# Patient Record
Sex: Male | Born: 1995 | Race: Black or African American | Hispanic: No | Marital: Single | Smoking: Never smoker
Health system: Southern US, Community
[De-identification: ages and names within clinical notes are randomized; demographics above are authoritative.]

## PROBLEM LIST (undated history)

## (undated) DIAGNOSIS — Z8719 Personal history of other diseases of the digestive system: Secondary | ICD-10-CM

## (undated) DIAGNOSIS — Z8711 Personal history of peptic ulcer disease: Secondary | ICD-10-CM

## (undated) DIAGNOSIS — I10 Essential (primary) hypertension: Secondary | ICD-10-CM

---
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
## 2014-04-23 ENCOUNTER — Encounter (HOSPITAL_COMMUNITY): Payer: Self-pay | Admitting: Emergency Medicine

## 2014-04-23 ENCOUNTER — Emergency Department (HOSPITAL_COMMUNITY)
Admission: EM | Admit: 2014-04-23 | Discharge: 2014-04-23 | Disposition: A | Discharge status: Home / Self Care | Payer: Medicaid Other | Attending: Emergency Medicine | Admitting: Emergency Medicine

## 2014-04-23 ENCOUNTER — Emergency Department (HOSPITAL_COMMUNITY): Payer: Medicaid Other

## 2014-04-23 DIAGNOSIS — S8990XA Unspecified injury of unspecified lower leg, initial encounter: Secondary | ICD-10-CM | POA: Insufficient documentation

## 2014-04-23 DIAGNOSIS — S0003XA Contusion of scalp, initial encounter: Secondary | ICD-10-CM | POA: Diagnosis not present

## 2014-04-23 DIAGNOSIS — S1093XA Contusion of unspecified part of neck, initial encounter: Principal | ICD-10-CM

## 2014-04-23 DIAGNOSIS — S99919A Unspecified injury of unspecified ankle, initial encounter: Secondary | ICD-10-CM

## 2014-04-23 DIAGNOSIS — S0993XA Unspecified injury of face, initial encounter: Secondary | ICD-10-CM | POA: Insufficient documentation

## 2014-04-23 DIAGNOSIS — S199XXA Unspecified injury of neck, initial encounter: Secondary | ICD-10-CM | POA: Diagnosis present

## 2014-04-23 DIAGNOSIS — S0083XA Contusion of other part of head, initial encounter: Secondary | ICD-10-CM | POA: Diagnosis not present

## 2014-04-23 DIAGNOSIS — M25571 Pain in right ankle and joints of right foot: Secondary | ICD-10-CM

## 2014-04-23 DIAGNOSIS — S99929A Unspecified injury of unspecified foot, initial encounter: Secondary | ICD-10-CM

## 2014-04-23 MED ORDER — HYDROCODONE-ACETAMINOPHEN 5-325 MG PO TABS
2.0000 | ORAL_TABLET | Freq: Once | ORAL | Status: AC
  Administered 2014-04-23: 2 via ORAL
  Filled 2014-04-23: qty 2
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
## 2014-04-23 NOTE — Progress Notes (Signed)
Orthopedic Tech Progress Note Patient Details:  Jeffrey Arias [birth year] [id number]  Ortho Devices Type of Ortho Device: Ankle Air splint Ortho Device/Splint Interventions: Application   Shawnie Pons 04/23/2014, 12:24 PM
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
## 2014-04-23 NOTE — ED Notes (Signed)
Pt and family members walked out without discharge instructions or discharge paperwork; No VS obtained before leaving

## 2014-04-24 ENCOUNTER — Encounter (HOSPITAL_COMMUNITY): Payer: Self-pay | Admitting: Emergency Medicine

## 2014-04-24 DIAGNOSIS — S5010XA Contusion of unspecified forearm, initial encounter: Secondary | ICD-10-CM | POA: Diagnosis not present

## 2014-04-24 DIAGNOSIS — S0003XA Contusion of scalp, initial encounter: Secondary | ICD-10-CM | POA: Insufficient documentation

## 2014-04-24 DIAGNOSIS — S0990XA Unspecified injury of head, initial encounter: Secondary | ICD-10-CM | POA: Diagnosis present

## 2014-04-24 DIAGNOSIS — S1093XA Contusion of unspecified part of neck, initial encounter: Principal | ICD-10-CM

## 2014-04-24 DIAGNOSIS — S0083XA Contusion of other part of head, initial encounter: Principal | ICD-10-CM | POA: Insufficient documentation
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
## 2014-04-25 ENCOUNTER — Emergency Department (HOSPITAL_COMMUNITY): Payer: Medicaid Other | Attending: Emergency Medicine

## 2014-04-25 ENCOUNTER — Emergency Department (HOSPITAL_COMMUNITY)
Admission: EM | Admit: 2014-04-25 | Discharge: 2014-04-25 | Disposition: A | Discharge status: Home / Self Care | Payer: Medicaid Other | Attending: Emergency Medicine | Admitting: Emergency Medicine

## 2014-04-25 DIAGNOSIS — R51 Headache: Secondary | ICD-10-CM

## 2014-04-25 DIAGNOSIS — R519 Headache, unspecified: Secondary | ICD-10-CM

## 2014-04-25 DIAGNOSIS — S0003XA Contusion of scalp, initial encounter: Secondary | ICD-10-CM

## 2014-04-25 DIAGNOSIS — S5011XA Contusion of right forearm, initial encounter: Secondary | ICD-10-CM

## 2014-04-25 MED ORDER — OXYCODONE-ACETAMINOPHEN 5-325 MG PO TABS: 1.0000 | ORAL_TABLET | Freq: Four times a day (QID) | ORAL | Status: DC | PRN

## 2014-04-25 MED ORDER — OXYCODONE-ACETAMINOPHEN 5-325 MG PO TABS
2.0000 | ORAL_TABLET | Freq: Once | ORAL | Status: AC
  Administered 2014-04-25: 2 via ORAL
  Filled 2014-04-25: qty 2

## 2014-04-25 NOTE — ED Notes (Signed)
Pt A&OX4, ambulatory at d/c with steady gait, NAD 

## 2014-04-25 NOTE — Discharge Instructions (Signed)
Recommend that you ice areas of injury. Take Percocet as needed for severe pain. Take naproxen for mild to moderate pain. Recommend he followup with your primary care provider. Return to the emergency department if you develop loss of consciousness, memory loss, numbness/weakness on one side of your body, or vision changes.  Contusion A contusion is a deep bruise. Contusions are the result of an injury that caused bleeding under the skin. The contusion may turn blue, purple, or yellow. Minor injuries will give you a painless contusion, but more severe contusions may stay painful and swollen for a few weeks.  CAUSES  A contusion is usually caused by a blow, trauma, or direct force to an area of the body. SYMPTOMS   Swelling and redness of the injured area.  Bruising of the injured area.  Tenderness and soreness of the injured area.  Pain. DIAGNOSIS  The diagnosis can be made by taking a history and physical exam. An X-ray, CT scan, or MRI may be needed to determine if there were any associated injuries, such as fractures. TREATMENT  Specific treatment will depend on what area of the body was injured. In general, the best treatment for a contusion is resting, icing, elevating, and applying cold compresses to the injured area. Over-the-counter medicines may also be recommended for pain control. Ask your caregiver what the best treatment is for your contusion. HOME CARE INSTRUCTIONS   Put ice on the injured area.  Put ice in a plastic bag.  Place a towel between your skin and the bag.  Leave the ice on for 15-20 minutes, 3-4 times a day, or as directed by your health care provider.  Only take over-the-counter or prescription medicines for pain, discomfort, or fever as directed by your caregiver. Your caregiver may recommend avoiding anti-inflammatory medicines (aspirin, ibuprofen, and naproxen) for 48 hours because these medicines may increase bruising.  Rest the injured area.  If  possible, elevate the injured area to reduce swelling. SEEK IMMEDIATE MEDICAL CARE IF:   You have increased bruising or swelling.  You have pain that is getting worse.  Your swelling or pain is not relieved with medicines. MAKE SURE YOU:   Understand these instructions.  Will watch your condition.  Will get help right away if you are not doing well or get worse. Document Released: 05/28/2005 Document Revised: 08/23/2013 Document Reviewed: 06/23/2011 [facility] Patient Information 2015 [place], [place]. This information is not intended to replace advice given to you by your health care provider. Make sure you discuss any questions you have with your health care provider.
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
## 2014-04-29 NOTE — ED Provider Notes (Signed)
Medical screening examination/treatment/procedure(s) were performed by non-physician practitioner and as supervising physician I was immediately available for consultation/collaboration.   EKG Interpretation None        Tomasita Crumble, MD 04/29/14 1714

## 2014-07-10 ENCOUNTER — Emergency Department (HOSPITAL_COMMUNITY)
Admission: EM | Admit: 2014-07-10 | Discharge: 2014-07-10 | Disposition: A | Discharge status: Home / Self Care | Payer: Medicaid Other | Attending: Emergency Medicine | Admitting: Emergency Medicine

## 2014-07-10 ENCOUNTER — Encounter (HOSPITAL_COMMUNITY): Payer: Self-pay | Admitting: Emergency Medicine

## 2014-07-10 DIAGNOSIS — M545 Low back pain, unspecified: Secondary | ICD-10-CM

## 2014-07-10 DIAGNOSIS — S3992XA Unspecified injury of lower back, initial encounter: Secondary | ICD-10-CM | POA: Diagnosis not present

## 2014-07-10 DIAGNOSIS — Y990 Civilian activity done for income or pay: Secondary | ICD-10-CM | POA: Insufficient documentation

## 2014-07-10 DIAGNOSIS — Y92410 Unspecified street and highway as the place of occurrence of the external cause: Secondary | ICD-10-CM | POA: Insufficient documentation

## 2014-07-10 DIAGNOSIS — Y9389 Activity, other specified: Secondary | ICD-10-CM | POA: Diagnosis not present

## 2014-07-10 MED ORDER — IBUPROFEN 800 MG PO TABS
800.0000 mg | ORAL_TABLET | Freq: Once | ORAL | Status: AC
  Administered 2014-07-10: 800 mg via ORAL
  Filled 2014-07-10: qty 1

## 2014-07-10 MED ORDER — CYCLOBENZAPRINE HCL 10 MG PO TABS
10.0000 mg | ORAL_TABLET | Freq: Once | ORAL | Status: AC
  Administered 2014-07-10: 10 mg via ORAL
  Filled 2014-07-10: qty 1

## 2014-07-10 MED ORDER — IBUPROFEN 800 MG PO TABS: 800.0000 mg | ORAL_TABLET | Freq: Three times a day (TID) | ORAL | Status: DC

## 2014-07-10 MED ORDER — CYCLOBENZAPRINE HCL 10 MG PO TABS: 10.0000 mg | ORAL_TABLET | Freq: Two times a day (BID) | ORAL | Status: DC | PRN
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
## 2014-07-10 NOTE — Discharge Instructions (Signed)
Muscle Strain °A muscle strain is an injury that occurs when a muscle is stretched beyond its normal length. Usually a small number of muscle fibers are torn when this happens. Muscle strain is rated in degrees. First-degree strains have the least amount of muscle fiber tearing and pain. Second-degree and third-degree strains have increasingly more tearing and pain.  °Usually, recovery from muscle strain takes 1-2 weeks. Complete healing takes 5-6 weeks.  °CAUSES  °Muscle strain happens when a sudden, violent force placed on a muscle stretches it too far. This may occur with lifting, sports, or a fall.  °RISK FACTORS °Muscle strain is especially common in athletes.  °SIGNS AND SYMPTOMS °At the site of the muscle strain, there may be: °· Pain. °· Bruising. °· Swelling. °· Difficulty using the muscle due to pain or lack of normal function. °DIAGNOSIS  °Your health care provider will perform a physical exam and ask about your medical history. °TREATMENT  °Often, the best treatment for a muscle strain is resting, icing, and applying cold compresses to the injured area.   °HOME CARE INSTRUCTIONS  °· Use the PRICE method of treatment to promote muscle healing during the first 2-3 days after your injury. The PRICE method involves: °· Protecting the muscle from being injured again. °· Restricting your activity and resting the injured body part. °· Icing your injury. To do this, put ice in a plastic bag. Place a towel between your skin and the bag. Then, apply the ice and leave it on from 15-20 minutes each hour. After the third day, switch to moist heat packs. °· Apply compression to the injured area with a splint or elastic bandage. Be careful not to wrap it too tightly. This may interfere with blood circulation or increase swelling. °· Elevate the injured body part above the level of your heart as often as you can. °· Only take over-the-counter or prescription medicines for pain, discomfort, or fever as directed by your  health care provider. °· Warming up prior to exercise helps to prevent future muscle strains. °SEEK MEDICAL CARE IF:  °· You have increasing pain or swelling in the injured area. °· You have numbness, tingling, or a significant loss of strength in the injured area. °MAKE SURE YOU:  °· Understand these instructions. °· Will watch your condition. °· Will get help right away if you are not doing well or get worse. °Document Released: 08/18/2005 Document Revised: 06/08/2013 Document Reviewed: 03/17/2013 °[employer]® Patient Information ©2015 [employer]. This information is not intended to replace advice given to you by your health care provider. Make sure you discuss any questions you have with your health care provider. ° °Motor Vehicle Collision °It is common to have multiple bruises and sore muscles after a motor vehicle collision (MVC). These tend to feel worse for the first 24 hours. You may have the most stiffness and soreness over the first several hours. You may also feel worse when you wake up the first morning after your collision. After this point, you will usually begin to improve with each day. The speed of improvement often depends on the severity of the collision, the number of injuries, and the location and nature of these injuries. °HOME CARE INSTRUCTIONS °· Put ice on the injured area. °¨ Put ice in a plastic bag. °¨ Place a towel between your skin and the bag. °¨ Leave the ice on for 15-20 minutes, 3-4 times a day, or as directed by your health care provider. °· Drink enough fluids to   keep your urine clear or pale yellow. Do not drink alcohol. °· Take a warm shower or bath once or twice a day. This will increase blood flow to sore muscles. °· You may return to activities as directed by your caregiver. Be careful when lifting, as this may aggravate neck or back pain. °· Only take over-the-counter or prescription medicines for pain, discomfort, or fever as directed by your caregiver. Do not use  aspirin. This may increase bruising and bleeding. °SEEK IMMEDIATE MEDICAL CARE IF: °· You have numbness, tingling, or weakness in the arms or legs. °· You develop severe headaches not relieved with medicine. °· You have severe neck pain, especially tenderness in the middle of the back of your neck. °· You have changes in bowel or bladder control. °· There is increasing pain in any area of the body. °· You have shortness of breath, light-headedness, dizziness, or fainting. °· You have chest pain. °· You feel sick to your stomach (nauseous), throw up (vomit), or sweat. °· You have increasing abdominal discomfort. °· There is blood in your urine, stool, or vomit. °· You have pain in your shoulder (shoulder strap areas). °· You feel your symptoms are getting worse. °MAKE SURE YOU: °· Understand these instructions. °· Will watch your condition. °· Will get help right away if you are not doing well or get worse. °Document Released: 08/18/2005 Document Revised: 01/02/2014 Document Reviewed: 01/15/2011 °[employer]® Patient Information ©2015 [employer]. This information is not intended to replace advice given to you by your health care provider. Make sure you discuss any questions you have with your health care provider. ° °
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
## 2014-07-31 ENCOUNTER — Emergency Department (HOSPITAL_COMMUNITY)
Admission: EM | Admit: 2014-07-31 | Discharge: 2014-07-31 | Disposition: A | Discharge status: Home / Self Care | Payer: Medicaid Other | Attending: Emergency Medicine | Admitting: Emergency Medicine

## 2014-07-31 ENCOUNTER — Encounter (HOSPITAL_COMMUNITY): Payer: Self-pay | Admitting: Emergency Medicine

## 2014-07-31 DIAGNOSIS — L089 Local infection of the skin and subcutaneous tissue, unspecified: Secondary | ICD-10-CM

## 2014-07-31 MED ORDER — HYDROCORTISONE 1 % EX CREA: TOPICAL_CREAM | CUTANEOUS | Status: AC

## 2014-07-31 MED ORDER — CEPHALEXIN 500 MG PO CAPS: 500.0000 mg | ORAL_CAPSULE | Freq: Four times a day (QID) | ORAL | Status: AC

## 2014-07-31 MED ORDER — LIDOCAINE HCL 1 % IJ SOLN
5.0000 mL | Freq: Once | INTRAMUSCULAR | Status: DC
  Filled 2014-07-31: qty 20

## 2014-07-31 NOTE — ED Provider Notes (Signed)
CSN: [id number]     Arrival date & time 07/31/14  1035 History   First MD Initiated Contact with Patient 07/31/14 1045     Chief Complaint  Patient presents with  . Recurrent Skin Infections     (Consider location/radiation/quality/duration/timing/severity/associated sxs/prior Treatment) HPI   Patient to the ED due to pustule to his left abdominal wall that started two days ago. He says it is painful to touch and red. He has not had fevers, nausea, vomiting, diarrhea. He has not had abdominal pain, weakness, dysuria, weakness. The patient appears well. Denies getting bit by an insect.  History reviewed. No pertinent past medical history. History reviewed. No pertinent past surgical history. No family history on file. History  Substance Use Topics  . Smoking status: Never Smoker   . Smokeless tobacco: Not on file  . Alcohol Use: Yes     Comment: Occasional    Review of Systems  10 Systems reviewed and are negative for acute change except as noted in the HPI.     Allergies  Review of patient's allergies indicates no known allergies.  Home Medications   Prior to Admission medications   Medication Sig Start Date End Date Taking? Authorizing Provider  ibuprofen (ADVIL,MOTRIN) 200 MG tablet Take 400 mg by mouth every 6 (six) hours as needed for moderate pain.   Yes Historical Provider, MD  cephALEXin (KEFLEX) 500 MG capsule Take 1 capsule (500 mg total) by mouth 4 (four) times daily. 07/31/14   Chonte Ricke Irine SealG Marlissa Emerick, PA-C  cyclobenzaprine (FLEXERIL) 10 MG tablet Take 1 tablet (10 mg total) by mouth 2 (two) times daily as needed for muscle spasms. Patient not taking: Reported on 07/31/2014 07/10/14   Melvenia BeamShari A Upstill, PA-C  hydrocortisone cream 1 % Apply to affected area 2 times daily for pain and inflammation 07/31/14   Dorthula Matasiffany G Daire Okimoto, PA-C  ibuprofen (ADVIL,MOTRIN) 800 MG tablet Take 1 tablet (800 mg total) by mouth 3 (three) times daily. Patient not taking: Reported on 07/31/2014  07/10/14   Melvenia BeamShari A Upstill, PA-C  oxyCODONE-acetaminophen (PERCOCET/ROXICET) 5-325 MG per tablet Take 1-2 tablets by mouth every 6 (six) hours as needed for moderate pain or severe pain. Patient not taking: Reported on 07/31/2014 04/25/14   Antony MaduraKelly Humes, PA-C   BP 140/78 mmHg  Pulse 72  Temp(Src) 97 F (36.1 C)  Resp 14  SpO2 100% Physical Exam  Constitutional: He appears well-developed and well-nourished. No distress.  HENT:  Head: Normocephalic and atraumatic.  Eyes: Pupils are equal, round, and reactive to light.  Neck: Normal range of motion. Neck supple.  Cardiovascular: Normal rate and regular rhythm.   Pulmonary/Chest: Effort normal.  Abdominal: Soft. Bowel sounds are normal. He exhibits no distension. There is no tenderness. There is no rigidity, no rebound, no guarding and no CVA tenderness. No hernia.    Abdomen is soft.  Neurological: He is alert.  Skin: Skin is warm and dry.  Nursing note and vitals reviewed.   ED Course  Procedures (including critical care time) Labs Review Labs Reviewed - No data to display  Imaging Review No results found.   EKG Interpretation None      MDM   Final diagnoses:  Pustule   Warm compresses, hydrocortisone cream and Keflex. The patient has no systemic symptoms of deeper infection it appears very superficial. Surrounding cellulitis most likely from irritation. He has been given  Strict return to ED precautions.  18 y.o.Jeffrey Arias's evaluation in the Emergency Department is complete. It has  been determined that no acute conditions requiring further emergency intervention are present at this time. The patient/guardian have been advised of the diagnosis and plan. We have discussed signs and symptoms that warrant return to the ED, such as changes or worsening in symptoms.  Vital signs are stable at discharge. Filed Vitals:   07/31/14 1046  BP: 140/78  Pulse: 72  Temp: 97 F (36.1 C)  Resp: 14    Patient/guardian has  voiced understanding and agreed to follow-up with the PCP or specialist.     Dorthula Matasiffany G Dex Blakely, PA-C 07/31/14 1922  Enid SkeensJoshua M Zavitz, MD 08/02/14 [phone]

## 2014-07-31 NOTE — Discharge Instructions (Signed)

## 2014-07-31 NOTE — ED Notes (Signed)
Pt reports abdominal furuncle that started two days ago, painful to touch, red contour. Denies fever.

## 2014-09-23 ENCOUNTER — Emergency Department (HOSPITAL_COMMUNITY)
Admission: EM | Admit: 2014-09-23 | Discharge: 2014-09-24 | Disposition: A | Discharge status: Home / Self Care | Payer: Medicaid Other | Attending: Emergency Medicine | Admitting: Emergency Medicine

## 2014-09-23 ENCOUNTER — Encounter (HOSPITAL_COMMUNITY): Payer: Self-pay | Admitting: Emergency Medicine

## 2014-09-23 DIAGNOSIS — Y998 Other external cause status: Secondary | ICD-10-CM | POA: Insufficient documentation

## 2014-09-23 DIAGNOSIS — F1210 Cannabis abuse, uncomplicated: Secondary | ICD-10-CM | POA: Diagnosis not present

## 2014-09-23 DIAGNOSIS — X58XXXA Exposure to other specified factors, initial encounter: Secondary | ICD-10-CM | POA: Insufficient documentation

## 2014-09-23 DIAGNOSIS — Z791 Long term (current) use of non-steroidal anti-inflammatories (NSAID): Secondary | ICD-10-CM | POA: Insufficient documentation

## 2014-09-23 DIAGNOSIS — Y9389 Activity, other specified: Secondary | ICD-10-CM | POA: Insufficient documentation

## 2014-09-23 DIAGNOSIS — Y9289 Other specified places as the place of occurrence of the external cause: Secondary | ICD-10-CM | POA: Insufficient documentation

## 2014-09-23 DIAGNOSIS — F329 Major depressive disorder, single episode, unspecified: Secondary | ICD-10-CM | POA: Diagnosis not present

## 2014-09-23 DIAGNOSIS — T65892A Toxic effect of other specified substances, intentional self-harm, initial encounter: Secondary | ICD-10-CM | POA: Diagnosis not present

## 2014-09-23 DIAGNOSIS — Z7952 Long term (current) use of systemic steroids: Secondary | ICD-10-CM | POA: Diagnosis not present

## 2014-09-23 DIAGNOSIS — Z008 Encounter for other general examination: Secondary | ICD-10-CM | POA: Diagnosis present

## 2014-09-23 DIAGNOSIS — F322 Major depressive disorder, single episode, severe without psychotic features: Secondary | ICD-10-CM | POA: Diagnosis present

## 2014-09-23 DIAGNOSIS — Z792 Long term (current) use of antibiotics: Secondary | ICD-10-CM | POA: Diagnosis not present

## 2014-09-23 DIAGNOSIS — X838XXA Intentional self-harm by other specified means, initial encounter: Secondary | ICD-10-CM

## 2014-09-23 LAB — COMPREHENSIVE METABOLIC PANEL
ALT: 28 U/L (ref 0–53)
ANION GAP: 7 (ref 5–15)
AST: 29 U/L (ref 0–37)
Albumin: 4.7 g/dL (ref 3.5–5.2)
Alkaline Phosphatase: 45 U/L (ref 39–117)
BILIRUBIN TOTAL: 0.7 mg/dL (ref 0.3–1.2)
BUN: 12 mg/dL (ref 6–23)
CHLORIDE: 104 mmol/L (ref 96–112)
CO2: 29 mmol/L (ref 19–32)
Calcium: 9.3 mg/dL (ref 8.4–10.5)
Creatinine, Ser: 0.81 mg/dL (ref 0.50–1.35)
GFR calc Af Amer: >90 mL/min (ref >90)
GFR calc non Af Amer: >90 mL/min (ref >90)
Glucose, Bld: 99 mg/dL (ref 70–99)
Potassium: 3.5 mmol/L (ref 3.5–5.1)
Sodium: 140 mmol/L (ref 135–145)
TOTAL PROTEIN: 7.5 g/dL (ref 6.0–8.3)

## 2014-09-23 LAB — SALICYLATE LEVEL: Salicylate Lvl: <4 mg/dL (ref 2.8–20.0)

## 2014-09-23 LAB — CBC
HCT: 44.1 % (ref 39.0–52.0)
HEMOGLOBIN: 14.9 g/dL (ref 13.0–17.0)
MCH: 31.7 pg (ref 26.0–34.0)
MCHC: 33.8 g/dL (ref 30.0–36.0)
MCV: 93.8 fL (ref 78.0–100.0)
Platelets: 205 10*3/uL (ref 150–400)
RBC: 4.7 MIL/uL (ref 4.22–5.81)
RDW: 12.4 % (ref 11.5–15.5)
WBC: 6.3 10*3/uL (ref 4.0–10.5)

## 2014-09-23 LAB — RAPID URINE DRUG SCREEN, HOSP PERFORMED
AMPHETAMINES: NOT DETECTED
BARBITURATES: NOT DETECTED
BENZODIAZEPINES: NOT DETECTED
COCAINE: NOT DETECTED
Opiates: NOT DETECTED
TETRAHYDROCANNABINOL: POSITIVE — AB

## 2014-09-23 LAB — ETHANOL: Alcohol, Ethyl (B): <5 mg/dL (ref 0–9)

## 2014-09-23 LAB — ACETAMINOPHEN LEVEL: Acetaminophen (Tylenol), Serum: <10 ug/mL — ABNORMAL LOW (ref 10–30)

## 2014-09-23 MED ORDER — NICOTINE 21 MG/24HR TD PT24
21.0000 mg | MEDICATED_PATCH | Freq: Every day | TRANSDERMAL | Status: DC
  Filled 2014-09-23: qty 1

## 2014-09-23 MED ORDER — ONDANSETRON HCL 4 MG PO TABS: 4.0000 mg | ORAL_TABLET | Freq: Three times a day (TID) | ORAL | Status: DC | PRN

## 2014-09-23 MED ORDER — ZOLPIDEM TARTRATE 5 MG PO TABS: 5.0000 mg | ORAL_TABLET | Freq: Every evening | ORAL | Status: DC | PRN

## 2014-09-23 MED ORDER — ALUM & MAG HYDROXIDE-SIMETH 200-200-20 MG/5ML PO SUSP: 30.0000 mL | ORAL | Status: DC | PRN

## 2014-09-23 MED ORDER — IBUPROFEN 200 MG PO TABS: 600.0000 mg | ORAL_TABLET | Freq: Three times a day (TID) | ORAL | Status: DC | PRN

## 2014-09-23 NOTE — ED Notes (Signed)
[facility] Expected date:  Expected time:  Means of arrival:  Comments: EMS 3M drank spic and span
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
## 2014-09-23 NOTE — ED Notes (Signed)
Poison Control called , case has already reported by EMS to Ms. Cheryl . Ms. Cheryl recommended to keep monitoring pt. For abdominal pain ,N/V, changes of V/S for the next 2 hours , none noted as of this time. Will continue to monitor pt.

## 2014-09-23 NOTE — ED Provider Notes (Signed)
CSN: [id number]     Arrival date & time 09/23/14  2001 History   First MD Initiated Contact with Patient 09/23/14 2001     Chief Complaint  Patient presents with  . Medical Clearance      HPI Patient reports he ingested a partially 7 ounces of a multipurpose cleaner called "spic and span" in attempt to harm himself.  He reports she's been depressed.  His been depressed for some time.  He has never spoken with a physician about this.  No prior hospitalizations a behavior health.  No prior suicide attempts.  Denies hallucinations.  Reports occasional alcohol use.  Denies tobacco abuse.  No other complaints except for mild nausea at this time.  No vomiting.   No past medical history on file. No past surgical history on file. No family history on file. History  Substance Use Topics  . Smoking status: Never Smoker   . Smokeless tobacco: Not on file  . Alcohol Use: Yes     Comment: Occasional    Review of Systems  All other systems reviewed and are negative.     Allergies  Review of patient's allergies indicates no known allergies.  Home Medications   Prior to Admission medications   Medication Sig Start Date End Date Taking? Authorizing Provider  ibuprofen (ADVIL,MOTRIN) 200 MG tablet Take 400 mg by mouth every 6 (six) hours as needed for moderate pain.   Yes Historical Provider, MD  cephALEXin (KEFLEX) 500 MG capsule Take 1 capsule (500 mg total) by mouth 4 (four) times daily. 07/31/14   Tiffany Irine Seal, PA-C  cyclobenzaprine (FLEXERIL) 10 MG tablet Take 1 tablet (10 mg total) by mouth 2 (two) times daily as needed for muscle spasms. Patient not taking: Reported on 07/31/2014 07/10/14   Melvenia Beam A Upstill, PA-C  hydrocortisone cream 1 % Apply to affected area 2 times daily for pain and inflammation 07/31/14   Dorthula Matas, PA-C  ibuprofen (ADVIL,MOTRIN) 800 MG tablet Take 1 tablet (800 mg total) by mouth 3 (three) times daily. Patient not taking: Reported on 07/31/2014 07/10/14    Melvenia Beam A Upstill, PA-C  oxyCODONE-acetaminophen (PERCOCET/ROXICET) 5-325 MG per tablet Take 1-2 tablets by mouth every 6 (six) hours as needed for moderate pain or severe pain. Patient not taking: Reported on 07/31/2014 04/25/14   Antony Madura, PA-C   There were no vitals taken for this visit. Physical Exam  Constitutional: He is oriented to person, place, and time. He appears well-developed and well-nourished.  HENT:  Head: Normocephalic and atraumatic.  Eyes: EOM are normal.  Neck: Normal range of motion.  Cardiovascular: Normal rate, regular rhythm, normal heart sounds and intact distal pulses.   Pulmonary/Chest: Effort normal and breath sounds normal. No respiratory distress.  Abdominal: Soft. He exhibits no distension. There is no tenderness.  Musculoskeletal: Normal range of motion.  Neurological: He is alert and oriented to person, place, and time.  Skin: Skin is warm and dry.  Psychiatric:  Depressed.  Flat affect.  Suicidal thoughts  Nursing note and vitals reviewed.   ED Course  Procedures (including critical care time) Labs Review Labs Reviewed  ACETAMINOPHEN LEVEL - Abnormal; Notable for the following:    Acetaminophen (Tylenol), Serum <10.0 (*)    All other components within normal limits  URINE RAPID DRUG SCREEN (HOSP PERFORMED) - Abnormal; Notable for the following:    Tetrahydrocannabinol POSITIVE (*)    All other components within normal limits  CBC  COMPREHENSIVE METABOLIC PANEL  ETHANOL  SALICYLATE LEVEL    Imaging Review No results found.   EKG Interpretation None      MDM   Final diagnoses:  None    Patient is medically clear at this time.  Patient will be evaluated by TTS.   Lyanne CoKevin M Shanasia Ibrahim, MD 09/23/14 2122

## 2014-09-23 NOTE — ED Notes (Signed)
Pt. Stated " i did not intend to kill myself by drinking that solution, i just have so much going on lately, i wanted to see my daughter.'

## 2014-09-23 NOTE — ED Notes (Signed)
Pt arrived via EMS, pt  Ingested unk. amount of Spic and Span multi purpose cleaner. Per pt was,this was a new bottle when he started. Estimated amount 7 oz. Per EMS. He states he just has a lot going on. He just needs to rest. C/o centralized chest pain after ingestion. 12 lead done and he was give zofran 4mg  IV in route.

## 2014-09-23 NOTE — ED Notes (Signed)
Pt. To [facility] from ED ambulatory without difficulty, to room  . Report from Lillie-Bell RN. Pt. Is alert and oriented, warm and dry in no distress. Pt. Denies SI, HI, and AVH. Pt. Calm and cooperative. Pt. Made aware of security cameras and Q15 minute rounds. Pt. Encouraged to let Nursing staff know of any concerns or needs.

## 2014-09-23 NOTE — BH Assessment (Signed)
Assessment completed. Consulted Alberteen SamFran Hobson, NP who recommended that pt be observed overnight and evaluated by psychiatry in the am. Dr. Patria Maneampos has been informed of the recommendation.

## 2014-09-23 NOTE — ED Notes (Signed)
TTS on going at consultation room.

## 2014-09-23 NOTE — ED Notes (Addendum)
Dr. Patria Maneampos at bedside and gave OK  For pt.'s family to come and see pt. At bedside before moving to Psych ED. Security aware, wanded pt., personal belongs and family . Pt. Is cooperative and calm. Pt./ on paper scrubs.

## 2014-09-24 DIAGNOSIS — F322 Major depressive disorder, single episode, severe without psychotic features: Secondary | ICD-10-CM

## 2014-09-24 NOTE — Consult Note (Signed)
[facility] Face-to-Face Psychiatry Consult   Reason for Consult:  Suicide attempt Referring Physician:  EDP Patient Identification: Jeffrey Arias MRN:  [id number] Principal Diagnosis: Severe major depression, single episode, without psychotic features Diagnosis:   Patient Active Problem List   Diagnosis Date Noted  . Severe major depression, single episode, without psychotic features [F32.2] 09/24/2014    Total Time spent with patient: 45 minutes  Subjective:   Jeffrey Arias is a 19 y.o. male patient admitted with Major depression, suicide attempt.  HPI:  AA male, 19 years old was evaluated for depression after he drank some cleaning liquid"Spic &Span"  Patient is remorseful today and stated that he will not repeat an attempt to kill himself.  Patient reported his stressors included not being able to see his 8 months daughter.  Patient reported that his baby's mother and her family have prevented him from seeing his daughter.  Patient reported that he has been feeling very depressed for not being able to see his daughter.  This has  made his depression Worse resulting in him feeling hopeless and helpless.  Patient denied previous SA.  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  He also planned to see a Psychiatrist at [place] this coming Monday to start treatment for depression.  Patient denies SI/HI/AVH today.  He will be discharged home and will go to [facility] first thing tomorrow morning.  Patient will be discharged home now.  HPI Elements:   Location:  Major depression, single episode, Suicide attempt.. Quality:  insomnia, angry feelings, poor energy, feelings of helplessness. Severity:  severe, drank cleaning Liquid. Timing:  Acute. Duration:  Few months back. Context:  Brought in by Ambulance after drinking cleaning Liquid.  Past Medical History: History reviewed. No pertinent past medical history. History reviewed. No pertinent past surgical history. Family  History: History reviewed. No pertinent family history. Social History:  History  Alcohol Use  . Yes    Comment: Occasional     History  Drug Use No    History   Social History  . Marital Status: Single    Spouse Name: N/A    Number of Children: N/A  . Years of Education: N/A   Social History Main Topics  . Smoking status: Never Smoker   . Smokeless tobacco: None  . Alcohol Use: Yes     Comment: Occasional  . Drug Use: No  . Sexual Activity: None   Other Topics Concern  . None   Social History Narrative   Additional Social History:    Pain Medications: pt denies abuse  Prescriptions: pt denies abuse  Over the Counter: pt denies abuse  History of alcohol / drug use?: Yes Longest period of sobriety (when/how long): 2 months  Name of Substance 1: THC  1 - Age of First Use: 15 1 - Amount (size/oz): "3 1/2 grams"  1 - Frequency: daily  1 - Duration: ongoing  1 - Last Use / Amount: "beginning of year"   Allergies:  No Known Allergies  Vitals: Blood pressure 103/43, pulse 72, temperature 97.5 F (36.4 C), temperature source Oral, resp. rate 18, SpO2 100 %.  Risk to Self: Suicidal Ideation: No-Not Currently/Within Last 6 Months (Pt denies SI but ingested household cleaner today. ) Suicidal Intent: No-Not Currently/Within Last 6 Months (Pt ingested spic and span today.) Is patient at risk for suicide?: Yes (PT ingested spic and span. ) Suicidal Plan?: No (PT denies having a plan but  ingested spic and span earlier ) Access to Means:  (Pt ingested spic and span. ) What has been your use of drugs/alcohol within the last 12 months?: Pt reported that he smokes THC.  How many times?: 0 Other Self Harm Risks: No other self harm risk identified at this time.  Triggers for Past Attempts: None known Intentional Self Injurious Behavior: None Risk to Others: Homicidal Ideation: No Thoughts of Harm to Others: No Current Homicidal Intent: No Current Homicidal Plan: No Access  to Homicidal Means: No Identified Victim: NA History of harm to others?: Yes (Injury to real property charge. ) Assessment of Violence: On admission Violent Behavior Description: No violent behaviors observed at this time. Pt is calm and cooperative.  Does patient have access to weapons?: No Criminal Charges Pending?: Yes Describe Pending Criminal Charges: Injury to real property  Does patient have a court date: Yes Court Date: 10/06/14 Prior Inpatient Therapy: Prior Inpatient Therapy: No Prior Outpatient Therapy: Prior Outpatient Therapy: Yes Prior Therapy Dates: 2006 [facility]  Reason for Treatment: Anger  Current Facility-Administered Medications  Medication Dose Route Frequency Provider Last Rate Last Dose  . alum & mag hydroxide-simeth (MAALOX/MYLANTA) 200-200-20 MG/5ML suspension 30 mL  30 mL Oral PRN Lyanne CoKevin M Campos, MD      . ibuprofen (ADVIL,MOTRIN) tablet 600 mg  600 mg Oral Q8H PRN Lyanne CoKevin M Campos, MD      . nicotine (NICODERM CQ - dosed in mg/24 hours) patch 21 mg  21 mg Transdermal Daily Lyanne CoKevin M Campos, MD   Stopped at 09/24/14 [phone]  . ondansetron (ZOFRAN) tablet 4 mg  4 mg Oral Q8H PRN Lyanne CoKevin M Campos, MD      . zolpidem [facility](AMBIEN) tablet 5 mg  5 mg Oral QHS PRN Lyanne CoKevin M Campos, MD       Current Outpatient Prescriptions  Medication Sig Dispense Refill  . ibuprofen (ADVIL,MOTRIN) 200 MG tablet Take 400 mg by mouth every 6 (six) hours as needed for moderate pain.    . cephALEXin (KEFLEX) 500 MG capsule Take 1 capsule (500 mg total) by mouth 4 (four) times daily. 40 capsule 0  . cyclobenzaprine (FLEXERIL) 10 MG tablet Take 1 tablet (10 mg total) by mouth 2 (two) times daily as needed for muscle spasms. (Patient not taking: Reported on 07/31/2014) 20 tablet 0  . hydrocortisone cream 1 % Apply to affected area 2 times daily for pain and inflammation 15 g 0  . ibuprofen (ADVIL,MOTRIN) 800 MG tablet Take 1 tablet (800 mg total) by mouth 3 (three)  times daily. (Patient not taking: Reported on 07/31/2014) 21 tablet 0  . oxyCODONE-acetaminophen (PERCOCET/ROXICET) 5-325 MG per tablet Take 1-2 tablets by mouth every 6 (six) hours as needed for moderate pain or severe pain. (Patient not taking: Reported on 07/31/2014) 15 tablet 0    Musculoskeletal: Strength & Muscle Tone: within normal limits Gait & Station: normal Patient leans: N/A  Psychiatric Specialty Exam:     Blood pressure 103/43, pulse 72, temperature 97.5 F (36.4 C), temperature source Oral, resp. rate 18, SpO2 100 %.There is no weight on file to calculate BMI.  General Appearance: Casual  Eye Contact::  Good  Speech:  Clear and Coherent and Normal Rate  Volume:  Normal  Mood:  Depressed, Hopeless and helpless  Affect:  Congruent, Depressed and Flat  Thought Process:  Coherent, Goal Directed and Intact  Orientation:  Full (Time, Place, and Person)  Thought Content:  WDL  Suicidal Thoughts:  No  Homicidal Thoughts:  No  Memory:  Immediate;   Good Recent;   Good Remote;   Good  Judgement:  Good  Insight:  Good  Psychomotor Activity:  Normal  Concentration:  Good  Recall:  NA  Fund of Knowledge:Good  Language: Good  Akathisia:  NA  Handed:  Right  AIMS (if indicated):     Assets:  Desire for Improvement  ADL's:  Intact  Cognition: WNL  Sleep:      Medical Decision Making: Established Problem, Stable/Improving (1)  Problem Points: Review of psycho-social stressors (1)  Data Points: Review or order clinical lab tests (1)  Treatment Plan Summary: Discharge home to follow up with [facility] outpatient favcility.  Plan:  No evidence of imminent risk to self or others at present.   Supportive therapy provided about ongoing stressors. Discussed crisis plan, support from social network, calling 911, coming to the Emergency Department, and calling Suicide Hotline. Discharge home Disposition: Discharge home  Earney Navy   PMHNP-BC 09/24/2014 10:46  AM  Patient seen, evaluated and I agree with notes by Nurse Practitioner. Thedore Mins, MD

## 2014-09-24 NOTE — [facility] Suicide Risk Assessment (Cosign Needed)
Suicide Risk Assessment  Discharge Assessment   [facility]   Demographic Factors:  Male, Low socioeconomic status and Unemployed  Total Time spent with patient: 30 minutes  Musculoskeletal: Strength & Muscle Tone: within normal limits Gait & Station: normal Patient leans: N/A  Psychiatric Specialty Exam:     Blood pressure 103/43, pulse 72, temperature 97.5 F (36.4 C), temperature source Oral, resp. rate 18, SpO2 100 %.There is no weight on file to calculate BMI.  General Appearance: Casual  Eye Contact::  Good  Speech:  Clear and Coherent and Normal Rate409  Volume:  Normal  Mood:  Angry, Depressed, Hopeless and helpless  Affect:  Congruent and Depressed  Thought Process:  Coherent, Goal Directed and Intact  Orientation:  Full (Time, Place, and Person)  Thought Content:  WDL  Suicidal Thoughts:  No  Homicidal Thoughts:  No  Memory:  Immediate;   Good Recent;   Good Remote;   Good  Judgement:  Good  Insight:  Good  Psychomotor Activity:  Normal  Concentration:  Good  Recall:  NA  Fund of Knowledge:Good  Language: Good  Akathisia:  NA  Handed:  Right  AIMS (if indicated):     Assets:  Desire for Improvement  Sleep:     Cognition: WNL  ADL's:  Intact      Has this patient used any form of tobacco in the last 30 days? (Cigarettes, Smokeless Tobacco, Cigars, and/or Pipes) N/A  Mental Status Per Nursing Assessment::   On Admission:     Current Mental Status by Physician: NA  Loss Factors: NA  Historical Factors: NA  Risk Reduction Factors:   Responsible for children under 20 years of age, Sense of responsibility to family, Living with another person, especially a relative and Want to live and see "my daughter grow up and thrive"  Continued Clinical Symptoms:  Depression:   Hopelessness Insomnia  Cognitive Features That Contribute To Risk:  Polarized thinking    Suicide Risk:  Minimal: No identifiable suicidal ideation.   Patients presenting with no risk factors but with morbid ruminations; may be classified as minimal risk based on the severity of the depressive symptoms  Principal Problem: Severe major depression, single episode, without psychotic features Discharge Diagnoses:  Patient Active Problem List   Diagnosis Date Noted  . Severe major depression, single episode, without psychotic features [F32.2] 09/24/2014      Plan Of Care/Follow-up recommendations:  Activity:  As tolerated Diet:  regular  Is patient on multiple antipsychotic therapies at discharge:  No   Has Patient had three or more failed trials of antipsychotic monotherapy by history:  No  Recommended Plan for Multiple Antipsychotic Therapies: NA    Gavriela Cashin, C   PMHNP-BC 09/24/2014, 11:16 AM

## 2015-02-10 ENCOUNTER — Encounter (HOSPITAL_COMMUNITY): Payer: Self-pay

## 2015-02-10 ENCOUNTER — Emergency Department (HOSPITAL_COMMUNITY): Payer: Medicaid Other

## 2015-02-10 ENCOUNTER — Emergency Department (HOSPITAL_COMMUNITY)
Admission: EM | Admit: 2015-02-10 | Discharge: 2015-02-10 | Disposition: A | Discharge status: Home / Self Care | Payer: Medicaid Other | Attending: Emergency Medicine | Admitting: Emergency Medicine

## 2015-02-10 DIAGNOSIS — Z23 Encounter for immunization: Secondary | ICD-10-CM | POA: Insufficient documentation

## 2015-02-10 DIAGNOSIS — S50312A Abrasion of left elbow, initial encounter: Secondary | ICD-10-CM | POA: Insufficient documentation

## 2015-02-10 DIAGNOSIS — S80211A Abrasion, right knee, initial encounter: Secondary | ICD-10-CM | POA: Diagnosis not present

## 2015-02-10 DIAGNOSIS — S90811A Abrasion, right foot, initial encounter: Secondary | ICD-10-CM | POA: Insufficient documentation

## 2015-02-10 DIAGNOSIS — S0990XA Unspecified injury of head, initial encounter: Secondary | ICD-10-CM | POA: Diagnosis present

## 2015-02-10 DIAGNOSIS — S40212A Abrasion of left shoulder, initial encounter: Secondary | ICD-10-CM | POA: Diagnosis not present

## 2015-02-10 DIAGNOSIS — S8002XA Contusion of left knee, initial encounter: Secondary | ICD-10-CM | POA: Diagnosis not present

## 2015-02-10 DIAGNOSIS — Y9389 Activity, other specified: Secondary | ICD-10-CM | POA: Insufficient documentation

## 2015-02-10 DIAGNOSIS — T07XXXA Unspecified multiple injuries, initial encounter: Secondary | ICD-10-CM

## 2015-02-10 DIAGNOSIS — Y92410 Unspecified street and highway as the place of occurrence of the external cause: Secondary | ICD-10-CM | POA: Diagnosis not present

## 2015-02-10 DIAGNOSIS — Z79899 Other long term (current) drug therapy: Secondary | ICD-10-CM | POA: Insufficient documentation

## 2015-02-10 DIAGNOSIS — S9031XA Contusion of right foot, initial encounter: Secondary | ICD-10-CM

## 2015-02-10 DIAGNOSIS — W19XXXA Unspecified fall, initial encounter: Secondary | ICD-10-CM

## 2015-02-10 DIAGNOSIS — Y998 Other external cause status: Secondary | ICD-10-CM | POA: Diagnosis not present

## 2015-02-10 DIAGNOSIS — S80212A Abrasion, left knee, initial encounter: Secondary | ICD-10-CM | POA: Diagnosis not present

## 2015-02-10 DIAGNOSIS — R52 Pain, unspecified: Secondary | ICD-10-CM

## 2015-02-10 MED ORDER — ONDANSETRON 4 MG PO TBDP
8.0000 mg | ORAL_TABLET | Freq: Once | ORAL | Status: AC
  Administered 2015-02-10: 8 mg via ORAL
  Filled 2015-02-10: qty 2

## 2015-02-10 MED ORDER — TETANUS-DIPHTH-ACELL PERTUSSIS 5-2.5-18.5 LF-MCG/0.5 IM SUSP
0.5000 mL | Freq: Once | INTRAMUSCULAR | Status: AC
  Administered 2015-02-10: 0.5 mL via INTRAMUSCULAR
  Filled 2015-02-10: qty 0.5

## 2015-02-10 MED ORDER — BACITRACIN ZINC 500 UNIT/GM EX OINT
TOPICAL_OINTMENT | Freq: Two times a day (BID) | CUTANEOUS | Status: DC
  Administered 2015-02-10: 1 via TOPICAL
  Filled 2015-02-10: qty 0.9

## 2015-02-10 MED ORDER — OXYCODONE-ACETAMINOPHEN 5-325 MG PO TABS
2.0000 | ORAL_TABLET | Freq: Once | ORAL | Status: AC
  Administered 2015-02-10: 2 via ORAL
  Filled 2015-02-10: qty 2

## 2015-02-10 MED ORDER — HYDROCODONE-ACETAMINOPHEN 5-325 MG PO TABS: 1.0000 | ORAL_TABLET | Freq: Four times a day (QID) | ORAL | Status: AC | PRN
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
## 2015-02-10 NOTE — Discharge Instructions (Signed)
It was our pleasure to provide your ER care today - we hope that you feel better.  Keep abrasions very clean - wash with warm water and soap 2x/day. You may apply a thin coat of bactracin to areas for the next few days.  Take motrin or aleve as need for pain. You may also take hydrocodone as need for pain. No driving for the next 6 hours or when taking hydrocodone. Also, do not take tylenol or acetaminophen containing medication when taking hydrocodone.  Follow up with primary care doctor in 1 week.  Return to ER if worse, new symptoms, severe headache, persistent vomiting, new or severe pain, infection of wounds, other concern.  You were given pain medication in the ER - no driving for the next 4 hours.     Abrasion An abrasion is a cut or scrape of the skin. Abrasions do not extend through all layers of the skin and most heal within 10 days. It is important to care for your abrasion properly to prevent infection. CAUSES  Most abrasions are caused by falling on, or gliding across, the ground or other surface. When your skin rubs on something, the outer and inner layer of skin rubs off, causing an abrasion. DIAGNOSIS  Your caregiver will be able to diagnose an abrasion during a physical exam.  TREATMENT  Your treatment depends on how large and deep the abrasion is. Generally, your abrasion will be cleaned with water and a mild soap to remove any dirt or debris. An antibiotic ointment may be put over the abrasion to prevent an infection. A bandage (dressing) may be wrapped around the abrasion to keep it from getting dirty.  You may need a tetanus shot if:  You cannot remember when you had your last tetanus shot.  You have never had a tetanus shot.  The injury broke your skin. If you get a tetanus shot, your arm may swell, get red, and feel warm to the touch. This is common and not a problem. If you need a tetanus shot and you choose not to have one, there is a rare chance of getting  tetanus. Sickness from tetanus can be serious.  HOME CARE INSTRUCTIONS   If a dressing was applied, change it at least once a day or as directed by your caregiver. If the bandage sticks, soak it off with warm water.   Wash the area with water and a mild soap to remove all the ointment 2 times a day. Rinse off the soap and pat the area dry with a clean towel.   Reapply any ointment as directed by your caregiver. This will help prevent infection and keep the bandage from sticking. Use gauze over the wound and under the dressing to help keep the bandage from sticking.   Change your dressing right away if it becomes wet or dirty.   Only take over-the-counter or prescription medicines for pain, discomfort, or fever as directed by your caregiver.   Follow up with your caregiver within 24-48 hours for a wound check, or as directed. If you were not given a wound-check appointment, look closely at your abrasion for redness, swelling, or pus. These are signs of infection. SEEK IMMEDIATE MEDICAL CARE IF:   You have increasing pain in the wound.   You have redness, swelling, or tenderness around the wound.   You have pus coming from the wound.   You have a fever or persistent symptoms for more than 2-3 days.  You have a  fever and your symptoms suddenly get worse.  You have a bad smell coming from the wound or dressing.  MAKE SURE YOU:   Understand these instructions.  Will watch your condition.  Will get help right away if you are not doing well or get worse. Document Released: 05/28/2005 Document Revised: 08/04/2012 Document Reviewed: 07/22/2011 [facility] Patient Information 2015 [place], [place]. This information is not intended to replace advice given to you by your health care provider. Make sure you discuss any questions you have with your health care provider.    Contusion A contusion is a deep bruise. Contusions are the result of an injury that caused bleeding under the  skin. The contusion may turn blue, purple, or yellow. Minor injuries will give you a painless contusion, but more severe contusions may stay painful and swollen for a few weeks.  CAUSES  A contusion is usually caused by a blow, trauma, or direct force to an area of the body. SYMPTOMS   Swelling and redness of the injured area.  Bruising of the injured area.  Tenderness and soreness of the injured area.  Pain. DIAGNOSIS  The diagnosis can be made by taking a history and physical exam. An X-ray, CT scan, or MRI may be needed to determine if there were any associated injuries, such as fractures. TREATMENT  Specific treatment will depend on what area of the body was injured. In general, the best treatment for a contusion is resting, icing, elevating, and applying cold compresses to the injured area. Over-the-counter medicines may also be recommended for pain control. Ask your caregiver what the best treatment is for your contusion. HOME CARE INSTRUCTIONS   Put ice on the injured area.  Put ice in a plastic bag.  Place a towel between your skin and the bag.  Leave the ice on for 15-20 minutes, 3-4 times a day, or as directed by your health care provider.  Only take over-the-counter or prescription medicines for pain, discomfort, or fever as directed by your caregiver. Your caregiver may recommend avoiding anti-inflammatory medicines (aspirin, ibuprofen, and naproxen) for 48 hours because these medicines may increase bruising.  Rest the injured area.  If possible, elevate the injured area to reduce swelling. SEEK IMMEDIATE MEDICAL CARE IF:   You have increased bruising or swelling.  You have pain that is getting worse.  Your swelling or pain is not relieved with medicines. MAKE SURE YOU:   Understand these instructions.  Will watch your condition.  Will get help right away if you are not doing well or get worse. Document Released: 05/28/2005 Document Revised: 08/23/2013  Document Reviewed: 06/23/2011 [facility] Patient Information 2015 [place], [place]. This information is not intended to replace advice given to you by your health care provider. Make sure you discuss any questions you have with your health care provider.   Head Injury You have received a head injury. It does not appear serious at this time. Headaches and vomiting are common following head injury. It should be easy to awaken from sleeping. Sometimes it is necessary for you to stay in the emergency department for a while for observation. Sometimes admission to the hospital may be needed. After injuries such as yours, most problems occur within the first 24 hours, but side effects may occur up to 7-10 days after the injury. It is important for you to carefully monitor your condition and contact your health care provider or seek immediate medical care if there is a change in your condition. WHAT ARE THE  TYPES OF HEAD INJURIES? Head injuries can be as minor as a bump. Some head injuries can be more severe. More severe head injuries include:  A jarring injury to the brain (concussion).  A bruise of the brain (contusion). This mean there is bleeding in the brain that can cause swelling.  A cracked skull (skull fracture).  Bleeding in the brain that collects, clots, and forms a bump (hematoma). WHAT CAUSES A HEAD INJURY? A serious head injury is most likely to happen to someone who is in a car wreck and is not wearing a seat belt. Other causes of major head injuries include bicycle or motorcycle accidents, sports injuries, and falls. HOW ARE HEAD INJURIES DIAGNOSED? A complete history of the event leading to the injury and your current symptoms will be helpful in diagnosing head injuries. Many times, pictures of the brain, such as CT or MRI are needed to see the extent of the injury. Often, an overnight hospital stay is necessary for observation.  WHEN SHOULD I SEEK IMMEDIATE MEDICAL CARE?  You should get  help right away if:  You have confusion or drowsiness.  You feel sick to your stomach (nauseous) or have continued, forceful vomiting.  You have dizziness or unsteadiness that is getting worse.  You have severe, continued headaches not relieved by medicine. Only take over-the-counter or prescription medicines for pain, fever, or discomfort as directed by your health care provider.  You do not have normal function of the arms or legs or are unable to walk.  You notice changes in the black spots in the center of the colored part of your eye (pupil).  You have a clear or bloody fluid coming from your nose or ears.  You have a loss of vision. During the next 24 hours after the injury, you must stay with someone who can watch you for the warning signs. This person should contact local emergency services (911 in the [place] if you have seizures, you become unconscious, or you are unable to wake up. HOW CAN I PREVENT A HEAD INJURY IN THE FUTURE? The most important factor for preventing major head injuries is avoiding motor vehicle accidents. To minimize the potential for damage to your head, it is crucial to wear seat belts while riding in motor vehicles. Wearing helmets while bike riding and playing collision sports (like football) is also helpful. Also, avoiding dangerous activities around the house will further help reduce your risk of head injury.  WHEN CAN I RETURN TO NORMAL ACTIVITIES AND ATHLETICS? You should be reevaluated by your health care provider before returning to these activities. If you have any of the following symptoms, you should not return to activities or contact sports until 1 week after the symptoms have stopped:  Persistent headache.  Dizziness or vertigo.  Poor attention and concentration.  Confusion.  Memory problems.  Nausea or vomiting.  Fatigue or tire easily.  Irritability.  Intolerant of bright lights or loud noises.  Anxiety or  depression.  Disturbed sleep. MAKE SURE YOU:   Understand these instructions.  Will watch your condition.  Will get help right away if you are not doing well or get worse. Document Released: 08/18/2005 Document Revised: 08/23/2013 Document Reviewed: 04/25/2013 [facility] Patient Information 2015 [place], [place]. This information is not intended to replace advice given to you by your health care provider. Make sure you discuss any questions you have with your health care provider.
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
## 2015-02-22 ENCOUNTER — Emergency Department (HOSPITAL_COMMUNITY)
Admission: EM | Admit: 2015-02-22 | Discharge: 2015-02-22 | Disposition: A | Discharge status: Home / Self Care | Payer: Medicaid Other | Attending: Emergency Medicine | Admitting: Emergency Medicine

## 2015-02-22 ENCOUNTER — Encounter (HOSPITAL_COMMUNITY): Payer: Self-pay | Admitting: Emergency Medicine

## 2015-02-22 DIAGNOSIS — L02415 Cutaneous abscess of right lower limb: Secondary | ICD-10-CM | POA: Diagnosis present

## 2015-02-22 DIAGNOSIS — L02416 Cutaneous abscess of left lower limb: Secondary | ICD-10-CM | POA: Diagnosis not present

## 2015-02-22 DIAGNOSIS — I10 Essential (primary) hypertension: Secondary | ICD-10-CM | POA: Insufficient documentation

## 2015-02-22 DIAGNOSIS — L0291 Cutaneous abscess, unspecified: Secondary | ICD-10-CM

## 2015-02-22 DIAGNOSIS — Z8719 Personal history of other diseases of the digestive system: Secondary | ICD-10-CM | POA: Insufficient documentation

## 2015-02-22 DIAGNOSIS — Z792 Long term (current) use of antibiotics: Secondary | ICD-10-CM | POA: Diagnosis not present

## 2015-02-22 HISTORY — DX: Essential (primary) hypertension: I10

## 2015-02-22 HISTORY — DX: Personal history of other diseases of the digestive system: Z87.19

## 2015-02-22 HISTORY — DX: Personal history of peptic ulcer disease: Z87.11

## 2015-02-22 MED ORDER — LIDOCAINE-EPINEPHRINE 2 %-1:100000 IJ SOLN
INTRAMUSCULAR | Status: AC
  Administered 2015-02-22: 12:00:00
  Filled 2015-02-22: qty 1

## 2015-02-22 MED ORDER — LIDOCAINE-EPINEPHRINE (PF) 2 %-1:200000 IJ SOLN: 10.0000 mL | Freq: Once | INTRAMUSCULAR | Status: DC

## 2015-02-22 MED ORDER — NAPROXEN 500 MG PO TABS: 500.0000 mg | ORAL_TABLET | Freq: Two times a day (BID) | ORAL | Status: AC

## 2015-02-22 MED ORDER — SULFAMETHOXAZOLE-TRIMETHOPRIM 800-160 MG PO TABS: 2.0000 | ORAL_TABLET | Freq: Two times a day (BID) | ORAL | Status: AC

## 2015-02-22 NOTE — ED Provider Notes (Signed)
CSN: [id number]     Arrival date & time 02/22/15  1008 History   First MD Initiated Contact with Patient 02/22/15 1023     Chief Complaint  Patient presents with  . Abscess    2 red raised areas, one spot on the outer aspect of each thigh     (Consider location/radiation/quality/duration/timing/severity/associated sxs/prior Treatment) HPI Comments: Patient presents today with two raised erythematous areas on the upper lateral thigh of both thighs.  He states that both of the areas have been there for the past 4 days and are gradually worsening.  He states that he thinks that he was bitten by a spider, but did not see a spider or actually feel the bite.  He states that he squeezed the areas a couple of days ago and expressed some purulent fluid.  He denies fever, chills, nausea, or vomiting.  He reports a history of Abscesses.  No history of DM or HIV.    Patient is a 19 y.o. male presenting with abscess. The history is provided by the patient.  Abscess   Past Medical History  Diagnosis Date  . Hypertension   . History of stomach ulcers    History reviewed. No pertinent past surgical history. No family history on file. History  Substance Use Topics  . Smoking status: Never Smoker   . Smokeless tobacco: Not on file  . Alcohol Use: Yes     Comment: Occasional    Review of Systems  All other systems reviewed and are negative.     Allergies  Review of patient's allergies indicates no known allergies.  Home Medications   Prior to Admission medications   Medication Sig Start Date End Date Taking? Authorizing Provider  cephALEXin (KEFLEX) 500 MG capsule Take 1 capsule (500 mg total) by mouth 4 (four) times daily. 07/31/14   Marlon Pel, PA-C  HYDROcodone-acetaminophen (NORCO/VICODIN) 5-325 MG per tablet Take 1-2 tablets by mouth every 6 (six) hours as needed for moderate pain. 02/10/15   Cathren Laine, MD  hydrocortisone cream 1 % Apply to affected area 2 times daily for pain  and inflammation 07/31/14   Tiffany Greene, PA-C   BP 130/75 mmHg  Pulse 89  Temp(Src) 98.5 F (36.9 C) (Oral)  Resp 18  SpO2 97% Physical Exam  Constitutional: He appears well-developed and well-nourished.  HENT:  Head: Normocephalic and atraumatic.  Neck: Normal range of motion. Neck supple.  Cardiovascular: Normal rate, regular rhythm and normal heart sounds.   Pulmonary/Chest: Effort normal and breath sounds normal.  Musculoskeletal: Normal range of motion.  Neurological: He is alert.  Skin: Skin is warm and dry.     Psychiatric: He has a normal mood and affect.  Nursing note and vitals reviewed.   ED Course  Procedures (including critical care time) Labs Review Labs Reviewed - No data to display  Imaging Review No results found.   EKG Interpretation None     INCISION AND DRAINAGE Performed by: Santiago Glad Consent: Verbal consent obtained. Risks and benefits: risks, benefits and alternatives were discussed Type: abscess  Body area: right lateral upper thigh  Anesthesia: local infiltration  Incision was made with a scalpel.  Local anesthetic: lidocaine 2% with epinephrine  Anesthetic total: 3 ml  Complexity: complex Blunt dissection to break up loculations  Drainage: purulent  Drainage amount: scant  Patient tolerance: Patient tolerated the procedure well with no immediate complications.  INCISION AND DRAINAGE Performed by: Santiago Glad Consent: Verbal consent obtained. Risks and benefits: risks, benefits  and alternatives were discussed Type: abscess  Body area: left upper lateral thigh  Anesthesia: local infiltration  Incision was made with a scalpel.  Local anesthetic: lidocaine 2% with epinephrine  Anesthetic total: 3 ml  Complexity: complex Blunt dissection to break up loculations  Drainage: purulent  Drainage amount: small  Patient tolerance: Patient tolerated the procedure well with no immediate  complications.       MDM   Final diagnoses:  None   Patient presents today with a chief complaint of an abscess on both the left upper thigh and the right upper thigh.  Patient is afebrile.  No systemic signs of infection.  No erythematous streaking.  Abscesses incised and drained in the ED.  Patient stable for discharge.  Return precautions given.    Santiago Glad, PA-C 02/22/15 1201  Derwood Kaplan, MD 02/23/15 [phone]

## 2015-02-22 NOTE — ED Notes (Signed)
2 red rraised spots noted on outer aspect of thighs, one on each. Approx, 2cm red swollen area surrounds each abscess. Pt is concerned that he may have spider bites

## 2016-06-23 ENCOUNTER — Encounter (HOSPITAL_COMMUNITY): Payer: Self-pay | Admitting: *Deleted

## 2016-06-23 ENCOUNTER — Emergency Department (HOSPITAL_COMMUNITY)
Admission: EM | Admit: 2016-06-23 | Discharge: 2016-06-23 | Disposition: A | Discharge status: Home / Self Care | Payer: Self-pay | Attending: Emergency Medicine | Admitting: Emergency Medicine

## 2016-06-23 ENCOUNTER — Emergency Department (HOSPITAL_COMMUNITY): Payer: Self-pay

## 2016-06-23 DIAGNOSIS — Y939 Activity, unspecified: Secondary | ICD-10-CM | POA: Insufficient documentation

## 2016-06-23 DIAGNOSIS — S62353A Nondisplaced fracture of shaft of third metacarpal bone, left hand, initial encounter for closed fracture: Secondary | ICD-10-CM | POA: Insufficient documentation

## 2016-06-23 DIAGNOSIS — Y999 Unspecified external cause status: Secondary | ICD-10-CM | POA: Insufficient documentation

## 2016-06-23 DIAGNOSIS — Y929 Unspecified place or not applicable: Secondary | ICD-10-CM | POA: Insufficient documentation

## 2016-06-23 DIAGNOSIS — I10 Essential (primary) hypertension: Secondary | ICD-10-CM | POA: Insufficient documentation

## 2016-06-23 MED ORDER — HYDROCODONE-ACETAMINOPHEN 5-325 MG PO TABS
1.0000 | ORAL_TABLET | Freq: Once | ORAL | Status: AC
  Administered 2016-06-23: 1 via ORAL
  Filled 2016-06-23: qty 1

## 2016-06-23 MED ORDER — NAPROXEN 500 MG PO TABS: 500.0000 mg | ORAL_TABLET | Freq: Two times a day (BID) | ORAL | 0 refills | Status: AC | PRN

## 2016-06-23 MED ORDER — HYDROCODONE-ACETAMINOPHEN 5-325 MG PO TABS: 1.0000 | ORAL_TABLET | Freq: Four times a day (QID) | ORAL | 0 refills | Status: AC | PRN
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
## 2016-06-23 NOTE — ED Notes (Signed)
Ice pack applied to left hand.

## 2016-06-23 NOTE — ED Notes (Signed)
Declined W/C at D/C and was escorted to lobby by RN. 
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
## 2016-06-23 NOTE — Progress Notes (Signed)
Orthopedic Tech Progress Note Patient Details:  Jeffrey RamsayDavid Masley 11/26/1995 [id number]  Ortho Devices Type of Ortho Device: Ace wrap, Volar splint Ortho Device/Splint Location: lue Ortho Device/Splint Interventions: Application   [name] 06/23/2016, 11:11 AM

## 2016-06-23 NOTE — Discharge Instructions (Signed)
Wear wrist/hand splint until you see the hand specialist. Ice and elevate wrist throughout the day, using ice pack for no more than 20 minutes every hour.  Alternate between naprosyn and norco for pain relief. Do not drive or operate machinery with pain medication use. Call hand specialist follow up today or tomorrow to schedule followup appointment for recheck and ongoing management of your hand fracture in the next 5-7 days. Return to the ER for changes or worsening symptoms.

## 2016-10-27 IMAGING — DX DG KNEE COMPLETE 4+V*R*
4 series · 4 of 4 positions shown · non-contrast
Comparison: None.

CLINICAL DATA: Fall with knee pain, initial encounter

EXAM:
RIGHT KNEE - COMPLETE 4+ VIEW

[knee ap]
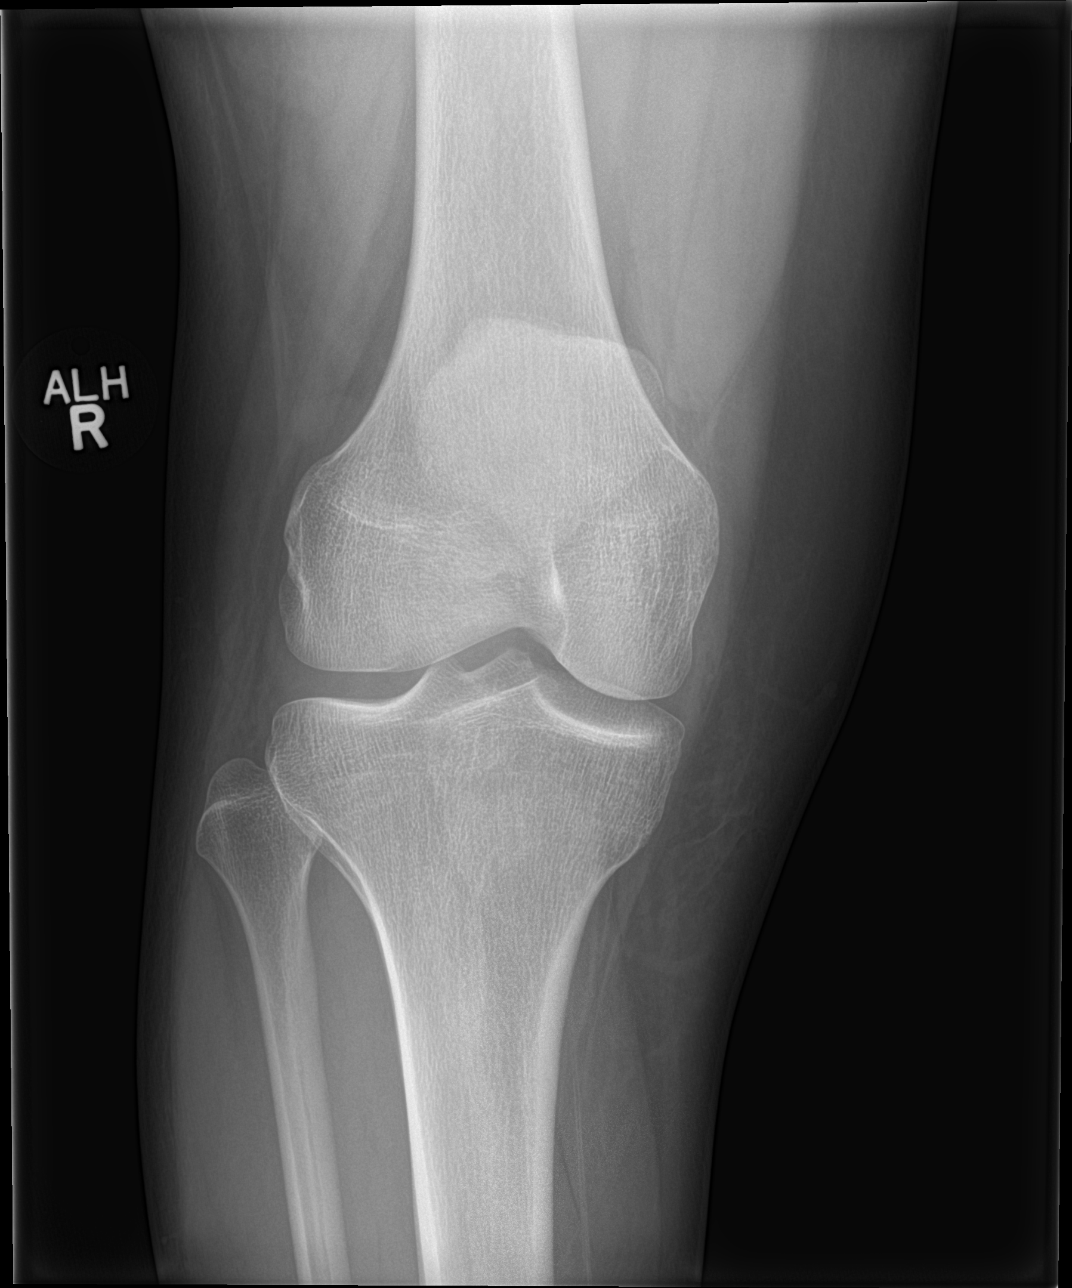

[knee lat]
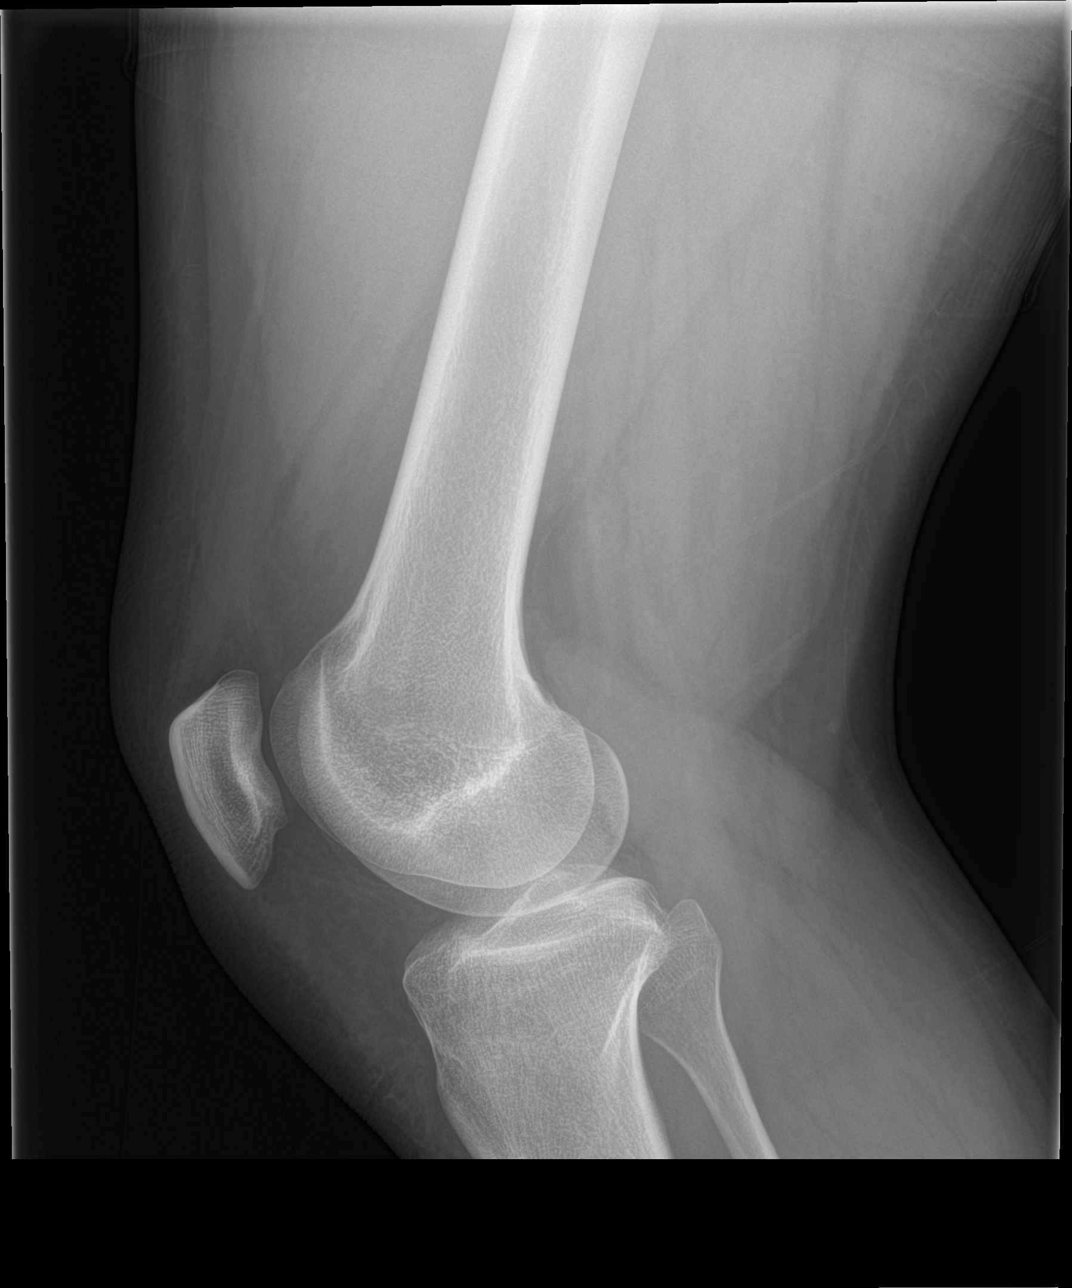

[knee obl (1 of 2)]
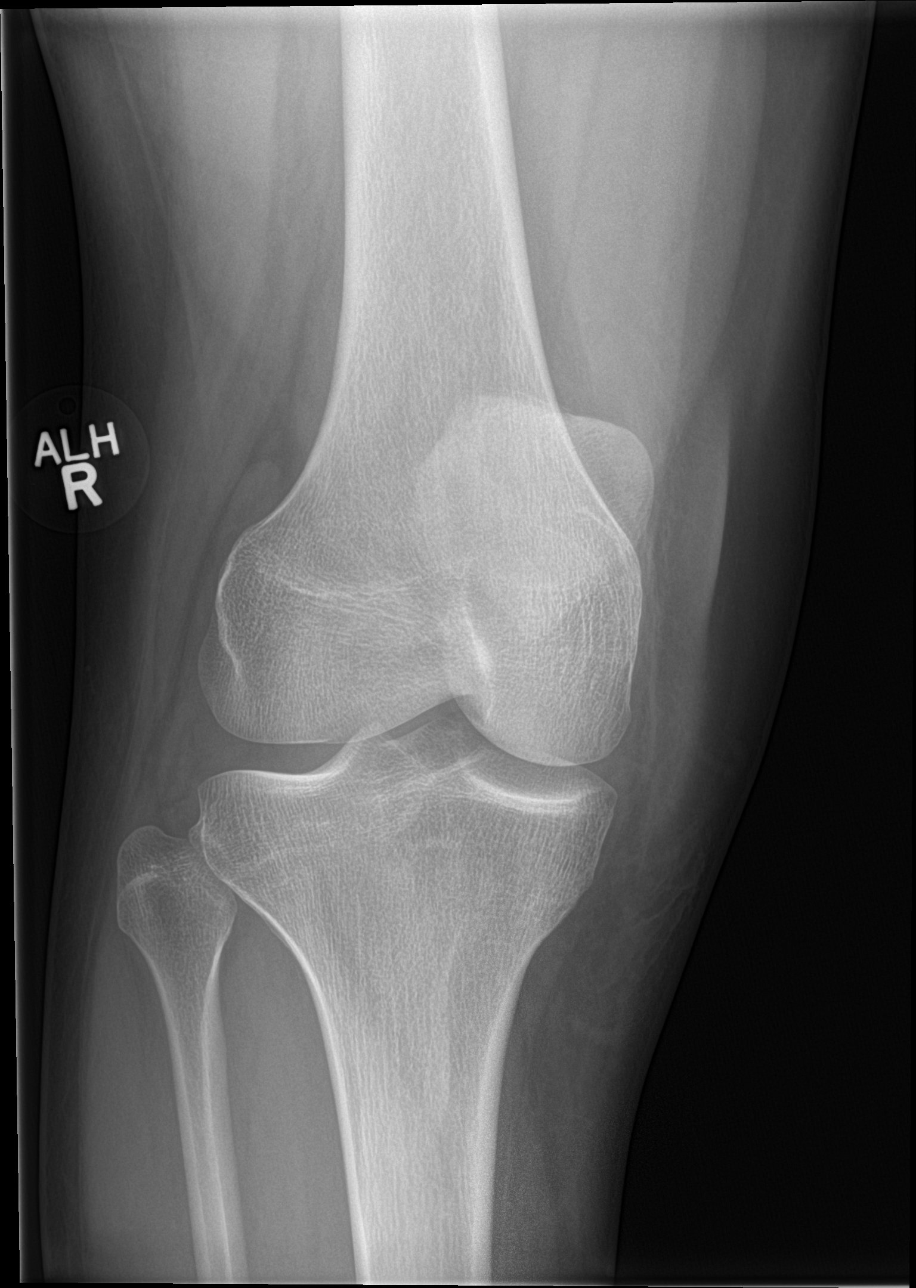

[knee obl (2 of 2)]
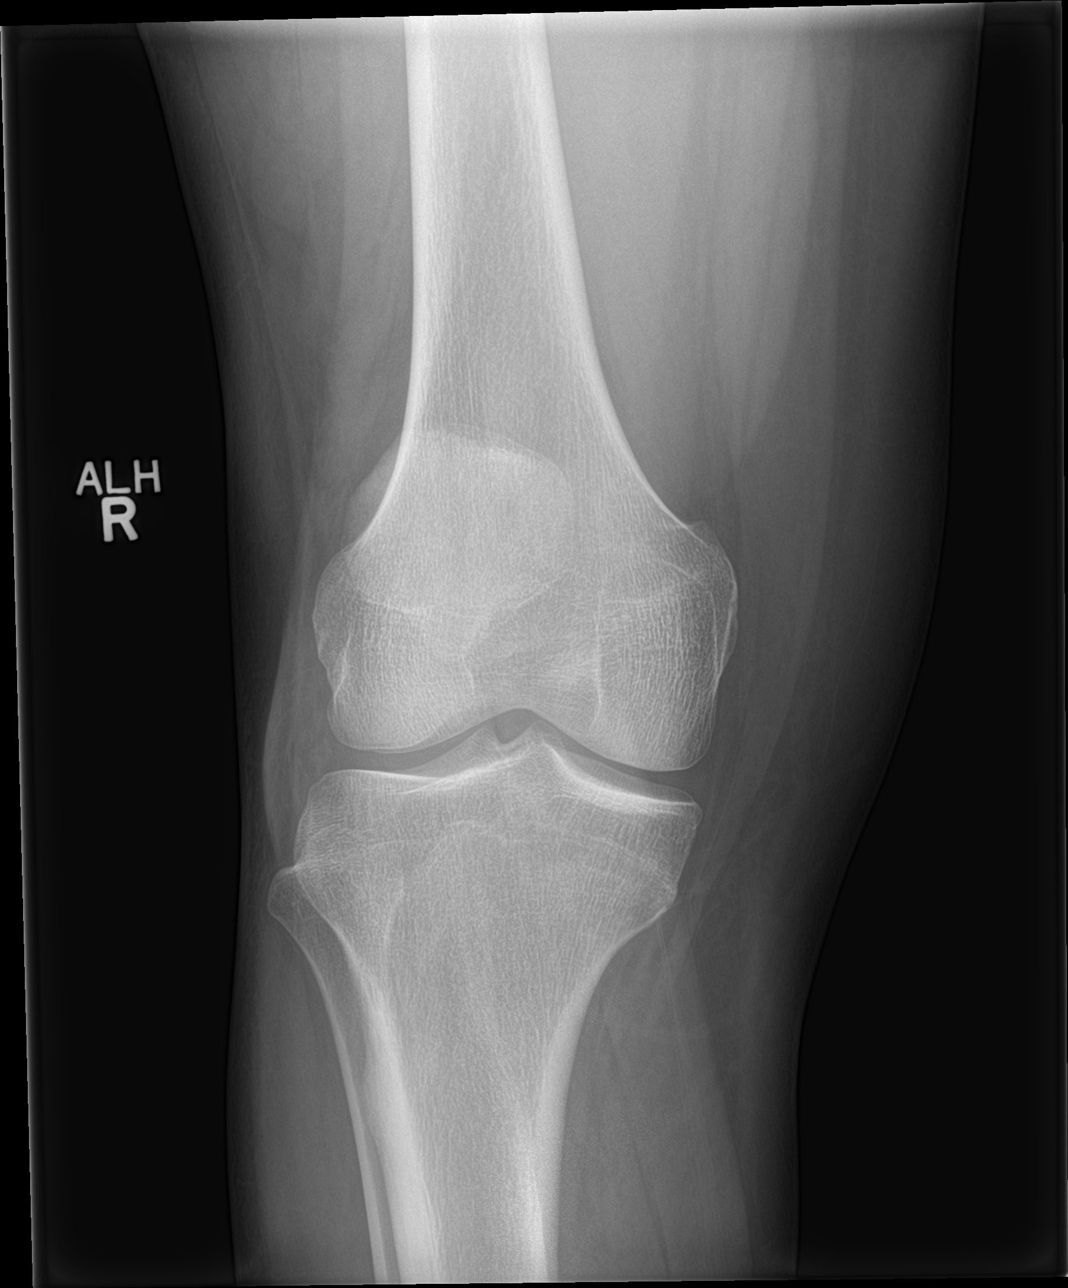

[4 of 4 positions shown; findings below may reference images not displayed]

FINDINGS: There is no evidence of fracture, dislocation, or joint effusion.
There is no evidence of arthropathy or other focal bone abnormality.
Soft tissues are unremarkable.
IMPRESSION: No acute abnormality noted.

## 2022-08-01 DIAGNOSIS — Z419 Encounter for procedure for purposes other than remedying health state, unspecified: Secondary | ICD-10-CM | POA: Diagnosis not present

## 2022-09-01 DIAGNOSIS — Z419 Encounter for procedure for purposes other than remedying health state, unspecified: Secondary | ICD-10-CM | POA: Diagnosis not present

## 2022-10-02 DIAGNOSIS — Z419 Encounter for procedure for purposes other than remedying health state, unspecified: Secondary | ICD-10-CM | POA: Diagnosis not present

## 2022-10-31 DIAGNOSIS — Z419 Encounter for procedure for purposes other than remedying health state, unspecified: Secondary | ICD-10-CM | POA: Diagnosis not present

## 2022-12-01 DIAGNOSIS — Z419 Encounter for procedure for purposes other than remedying health state, unspecified: Secondary | ICD-10-CM | POA: Diagnosis not present

## 2022-12-31 DIAGNOSIS — Z419 Encounter for procedure for purposes other than remedying health state, unspecified: Secondary | ICD-10-CM | POA: Diagnosis not present

## 2023-01-31 DIAGNOSIS — Z419 Encounter for procedure for purposes other than remedying health state, unspecified: Secondary | ICD-10-CM | POA: Diagnosis not present

## 2023-03-02 DIAGNOSIS — Z419 Encounter for procedure for purposes other than remedying health state, unspecified: Secondary | ICD-10-CM | POA: Diagnosis not present

## 2023-04-02 DIAGNOSIS — Z419 Encounter for procedure for purposes other than remedying health state, unspecified: Secondary | ICD-10-CM | POA: Diagnosis not present

## 2023-05-03 DIAGNOSIS — Z419 Encounter for procedure for purposes other than remedying health state, unspecified: Secondary | ICD-10-CM | POA: Diagnosis not present

## 2023-06-02 DIAGNOSIS — Z419 Encounter for procedure for purposes other than remedying health state, unspecified: Secondary | ICD-10-CM | POA: Diagnosis not present

## 2023-07-03 DIAGNOSIS — Z419 Encounter for procedure for purposes other than remedying health state, unspecified: Secondary | ICD-10-CM | POA: Diagnosis not present

## 2023-08-02 DIAGNOSIS — Z419 Encounter for procedure for purposes other than remedying health state, unspecified: Secondary | ICD-10-CM | POA: Diagnosis not present

## 2023-09-02 DIAGNOSIS — Z419 Encounter for procedure for purposes other than remedying health state, unspecified: Secondary | ICD-10-CM | POA: Diagnosis not present

## 2023-10-03 DIAGNOSIS — Z419 Encounter for procedure for purposes other than remedying health state, unspecified: Secondary | ICD-10-CM | POA: Diagnosis not present

## 2023-10-31 DIAGNOSIS — Z419 Encounter for procedure for purposes other than remedying health state, unspecified: Secondary | ICD-10-CM | POA: Diagnosis not present

## 2023-12-12 DIAGNOSIS — Z419 Encounter for procedure for purposes other than remedying health state, unspecified: Secondary | ICD-10-CM | POA: Diagnosis not present

## 2024-01-11 DIAGNOSIS — Z419 Encounter for procedure for purposes other than remedying health state, unspecified: Secondary | ICD-10-CM | POA: Diagnosis not present

## 2024-01-12 DIAGNOSIS — Z743 Need for continuous supervision: Secondary | ICD-10-CM | POA: Diagnosis not present

## 2024-01-12 DIAGNOSIS — T07XXXA Unspecified multiple injuries, initial encounter: Secondary | ICD-10-CM | POA: Diagnosis not present

## 2024-02-11 DIAGNOSIS — Z419 Encounter for procedure for purposes other than remedying health state, unspecified: Secondary | ICD-10-CM | POA: Diagnosis not present

## 2024-03-12 DIAGNOSIS — Z419 Encounter for procedure for purposes other than remedying health state, unspecified: Secondary | ICD-10-CM | POA: Diagnosis not present

## 2024-04-12 DIAGNOSIS — Z419 Encounter for procedure for purposes other than remedying health state, unspecified: Secondary | ICD-10-CM | POA: Diagnosis not present

## 2024-05-13 DIAGNOSIS — Z419 Encounter for procedure for purposes other than remedying health state, unspecified: Secondary | ICD-10-CM | POA: Diagnosis not present
